# Patient Record
Sex: Male | Born: 1955 | Race: Black or African American | Hispanic: No | Marital: Single | Smoking: Never smoker
Health system: Southern US, Community
[De-identification: ages and names within clinical notes are randomized; demographics above are authoritative.]

---

## 2017-08-06 ENCOUNTER — Other Ambulatory Visit: Payer: Self-pay

## 2017-08-06 ENCOUNTER — Encounter (HOSPITAL_COMMUNITY): Payer: Self-pay | Admitting: Emergency Medicine

## 2017-08-06 ENCOUNTER — Emergency Department (HOSPITAL_COMMUNITY)
Admission: EM | Admit: 2017-08-06 | Discharge: 2017-08-06 | Disposition: A | Discharge status: Home / Self Care | Payer: Medicaid Other | Attending: Emergency Medicine | Admitting: Emergency Medicine

## 2017-08-06 ENCOUNTER — Emergency Department (HOSPITAL_COMMUNITY): Payer: Medicaid Other

## 2017-08-06 DIAGNOSIS — M791 Myalgia, unspecified site: Secondary | ICD-10-CM | POA: Insufficient documentation

## 2017-08-06 DIAGNOSIS — R609 Edema, unspecified: Secondary | ICD-10-CM

## 2017-08-06 DIAGNOSIS — R0682 Tachypnea, not elsewhere classified: Secondary | ICD-10-CM | POA: Diagnosis not present

## 2017-08-06 DIAGNOSIS — R509 Fever, unspecified: Secondary | ICD-10-CM

## 2017-08-06 DIAGNOSIS — R112 Nausea with vomiting, unspecified: Secondary | ICD-10-CM | POA: Diagnosis present

## 2017-08-06 DIAGNOSIS — R638 Other symptoms and signs concerning food and fluid intake: Secondary | ICD-10-CM | POA: Diagnosis not present

## 2017-08-06 DIAGNOSIS — Z7982 Long term (current) use of aspirin: Secondary | ICD-10-CM | POA: Insufficient documentation

## 2017-08-06 DIAGNOSIS — R6883 Chills (without fever): Secondary | ICD-10-CM | POA: Diagnosis not present

## 2017-08-06 DIAGNOSIS — Z79899 Other long term (current) drug therapy: Secondary | ICD-10-CM | POA: Insufficient documentation

## 2017-08-06 LAB — COMPREHENSIVE METABOLIC PANEL
ALBUMIN: 4.1 g/dL (ref 3.5–5.0)
ALT: 12 U/L — ABNORMAL LOW (ref 17–63)
AST: 15 U/L (ref 15–41)
Alkaline Phosphatase: 41 U/L (ref 38–126)
Anion gap: 11 (ref 5–15)
BILIRUBIN TOTAL: 1.3 mg/dL — AB (ref 0.3–1.2)
BUN: 15 mg/dL (ref 6–20)
CO2: 24 mmol/L (ref 22–32)
Calcium: 8.9 mg/dL (ref 8.9–10.3)
Chloride: 101 mmol/L (ref 101–111)
Creatinine, Ser: 1.11 mg/dL (ref 0.61–1.24)
GFR calc non Af Amer: >60 mL/min (ref >60)
GLUCOSE: 124 mg/dL — AB (ref 65–99)
POTASSIUM: 3.8 mmol/L (ref 3.5–5.1)
SODIUM: 136 mmol/L (ref 135–145)
TOTAL PROTEIN: 7.5 g/dL (ref 6.5–8.1)

## 2017-08-06 LAB — CBC
HEMATOCRIT: 37.6 % — AB (ref 39.0–52.0)
HEMOGLOBIN: 11.5 g/dL — AB (ref 13.0–17.0)
MCH: 30.2 pg (ref 26.0–34.0)
MCHC: 30.6 g/dL (ref 30.0–36.0)
MCV: 98.7 fL (ref 78.0–100.0)
Platelets: 237 10*3/uL (ref 150–400)
RBC: 3.81 MIL/uL — ABNORMAL LOW (ref 4.22–5.81)
RDW: 13 % (ref 11.5–15.5)
WBC: 10.3 10*3/uL (ref 4.0–10.5)

## 2017-08-06 LAB — URINALYSIS, ROUTINE W REFLEX MICROSCOPIC
BACTERIA UA: NONE SEEN
BILIRUBIN URINE: NEGATIVE
Glucose, UA: NEGATIVE mg/dL
KETONES UR: NEGATIVE mg/dL
LEUKOCYTES UA: NEGATIVE
NITRITE: NEGATIVE
Protein, ur: 100 mg/dL — AB
Specific Gravity, Urine: 1.019 (ref 1.005–1.030)
pH: 5 (ref 5.0–8.0)

## 2017-08-06 LAB — TROPONIN I: Troponin I: <0.03 ng/mL (ref <0.03)

## 2017-08-06 LAB — INFLUENZA PANEL BY PCR (TYPE A & B)
Influenza A By PCR: NEGATIVE
Influenza B By PCR: NEGATIVE

## 2017-08-06 LAB — BRAIN NATRIURETIC PEPTIDE: B Natriuretic Peptide: 139 pg/mL — ABNORMAL HIGH (ref 0.0–100.0)

## 2017-08-06 LAB — LIPASE, BLOOD: Lipase: 27 U/L (ref 11–51)

## 2017-08-06 LAB — GROUP A STREP BY PCR: GROUP A STREP BY PCR: NOT DETECTED

## 2017-08-06 MED ORDER — ACETAMINOPHEN 325 MG PO TABS
650.0000 mg | ORAL_TABLET | Freq: Once | ORAL | Status: AC
  Administered 2017-08-06: 650 mg via ORAL
  Filled 2017-08-06: qty 2

## 2017-08-06 MED ORDER — IBUPROFEN 800 MG PO TABS
800.0000 mg | ORAL_TABLET | Freq: Once | ORAL | Status: AC
  Administered 2017-08-06: 800 mg via ORAL
  Filled 2017-08-06: qty 1

## 2017-08-06 MED ORDER — SODIUM CHLORIDE 0.9 % IV BOLUS
1000.0000 mL | Freq: Once | INTRAVENOUS | Status: AC
  Administered 2017-08-06: 1000 mL via INTRAVENOUS

## 2017-08-06 MED ORDER — ONDANSETRON HCL 4 MG/2ML IJ SOLN
4.0000 mg | Freq: Once | INTRAMUSCULAR | Status: AC
  Administered 2017-08-06: 4 mg via INTRAVENOUS
  Filled 2017-08-06: qty 2

## 2017-08-06 NOTE — ED Triage Notes (Signed)
Pt is aching all over, headache, as well as nausea and vomiting. This has been going on for 2 days without relief. Pt threw up green bile. Pt has not been taking any OTC meds and can't recall a fever.

## 2017-08-06 NOTE — ED Notes (Signed)
Ambulated pt around nurses station. Pt ambulated with minimal assistance and a steady gait.

## 2017-08-06 NOTE — ED Provider Notes (Signed)
[facility] EMERGENCY DEPARTMENT Provider Note   CSN: [id number] Arrival date & time: 08/06/17  1159     History   Chief Complaint Chief Complaint  Patient presents with  . Nausea    HPI Peter HandlerHenry Roman is a 62 y.o. male.  HPI  The patient is a morbidly obese 62 year old male who reports a prior history of hypertension, he denies tobacco use cardiac disease, pulmonary disease or diabetes.  He reports that he is good about taking his medications and then was in his usual state of health until 2 days ago when he started to develop fevers chills and shaking chills with some myalgias.  He denies significant coughing sore throat headache runny nose blurred vision and has not had no abdominal pain dysuria diarrhea or rectal bleeding.  There is no rashes to his skin, no recent insect bites, he and his wife who is an additional historian endorse that he has had some swelling of his legs which is chronic but gradually worsening.  He did have some associated nausea and vomiting 2 or 3 times today.  He is no longer nauseated and again has no abdominal pain.  His appetite is been decreased, not eating very much, not drinking very much in the way of fluid.  He has no stiff neck, no nasal congestion facial pressure or sinus tenderness.  No medications given prior to arrival.  He does report that he has had some difficulty with balance when he gets to shaking from his fever.  He was noted to have a temperature over 102 in triage  History reviewed. No pertinent past medical history.  There are no active problems to display for this patient.   History reviewed. No pertinent surgical history.      Home Medications    Prior to Admission medications   Medication Sig Start Date End Date Taking? Authorizing Provider  acetaminophen (TYLENOL) 500 MG tablet Take 1,000 mg by mouth every 6 (six) hours as needed.   Yes [provider]  albuterol (PROVENTIL) (2.5 MG/3ML) 0.083% nebulizer solution   07/11/17  Yes [provider]  aspirin EC 81 MG tablet Take 81 mg by mouth daily.   Yes [provider]  azelastine (ASTELIN) 0.1 % nasal spray Place 1 spray into both nostrils 2 (two) times daily. Use in each nostril as directed   Yes [provider]  busPIRone (BUSPAR) 15 MG tablet Take 15 mg by mouth 2 (two) times daily.   Yes [provider]  diltiazem (CARDIZEM CD) 360 MG 24 hr capsule Take 360 mg by mouth daily.   Yes [provider]  doxycycline (VIBRAMYCIN) 100 MG capsule Take 100 mg by mouth 2 (two) times daily. 10 day course starting on 4/16 08/05/17  Yes [provider]  fluticasone (FLONASE) 50 MCG/ACT nasal spray Place 2 sprays into both nostrils daily.  07/11/17  Yes [provider]  fluticasone furoate-vilanterol (BREO ELLIPTA) 200-25 MCG/INH AEPB Inhale 1 puff into the lungs daily.   Yes [provider]  furosemide (LASIX) 20 MG tablet Take 20 mg by mouth daily. 07/16/17  Yes [provider]  ipratropium (ATROVENT) 0.02 % nebulizer solution Take 0.25 mg by nebulization every 6 (six) hours as needed for wheezing or shortness of breath.  07/11/17  Yes [provider]  labetalol (NORMODYNE) 300 MG tablet Take 300 mg by mouth 3 (three) times daily.   Yes [provider]  losartan (COZAAR) 100 MG tablet Take 100 mg by mouth  daily.   Yes [provider]  terazosin (HYTRIN) 5 MG capsule Take 5 mg by mouth daily. 07/29/17  Yes [provider]    Family History History reviewed. No pertinent family history.  Social History Social History   Tobacco Use  . Smoking status: Never Smoker  . Smokeless tobacco: Never Used  Substance Use Topics  . Alcohol use: Never    Frequency: Never  . Drug use: Never     Allergies   Patient has no known allergies.   Review of Systems Review of Systems  All other systems reviewed and are negative.    Physical Exam Updated Vital  Signs BP (!) 170/65   Pulse 79   Temp (!) 100.6 F (38.1 C) (Oral)   Resp 18   Ht 5\' 10"  (1.778 m)   Wt (!) 163.3 kg (360 lb)   SpO2 93%   BMI 51.65 kg/m   Physical Exam  Constitutional: He appears well-developed and well-nourished. No distress.  HENT:  Head: Normocephalic and atraumatic.  Mouth/Throat: Oropharynx is clear and moist. No oropharyngeal exudate.  His membranes are mildly dehydrated but there is no signs of exudate asymmetry hypertrophy or erythema, nasal passages clear  Eyes: Pupils are equal, round, and reactive to light. Conjunctivae and EOM are normal. Right eye exhibits no discharge. Left eye exhibits no discharge. No scleral icterus.  Neck: Normal range of motion. Neck supple. No JVD present. No thyromegaly present.  Very supple neck, no lymphadenopathy  Cardiovascular: Normal rate, regular rhythm, normal heart sounds and intact distal pulses. Exam reveals no gallop and no friction rub.  No murmur heard. Pulmonary/Chest: Effort normal and breath sounds normal. No respiratory distress. He has no wheezes. He has no rales.  The patient is mildly tachypneic but his lungs are overall clear with no rales rhonchi or wheezing.  He speaks in full sentences.  He does prefer to sit straight upright and does not want to lay back stating that it makes him feel like he is more jittery  Abdominal: Soft. Bowel sounds are normal. He exhibits no distension and no mass. There is no tenderness.  Very obese abdomen but totally soft and nontender, no focal tenderness especially right upper and right lower quadrants which are totally benign  Musculoskeletal: Normal range of motion. He exhibits edema. He exhibits no tenderness.  The patient has bilateral pitting edema which is symmetrical of his lower extremities.  Lymphadenopathy:    He has no cervical adenopathy.  Neurological: He is alert. Coordination normal.  Mental status is totally normal, speech is clear, memory is intact and  coordination is normal.  He has normal speech, no facial droop, normal strength in all 4 extremities  Skin: Skin is warm and dry. No rash noted. No erythema.  I see no rashes to his skin diffusely  Psychiatric: He has a normal mood and affect. His behavior is normal.  Nursing note and vitals reviewed.    ED Treatments / Results  Labs (all labs ordered are listed, but only abnormal results are displayed) Labs Reviewed  COMPREHENSIVE METABOLIC PANEL - Abnormal; Notable for the following components:      Result Value   Glucose, Bld 124 (*)    ALT 12 (*)    Total Bilirubin 1.3 (*)    All other components within normal limits  CBC - Abnormal; Notable for the following components:   RBC 3.81 (*)    Hemoglobin 11.5 (*)    HCT 37.6 (*)  All other components within normal limits  URINALYSIS, ROUTINE W REFLEX MICROSCOPIC - Abnormal; Notable for the following components:   Hgb urine dipstick MODERATE (*)    Protein, ur 100 (*)    Squamous Epithelial / LPF 0-5 (*)    All other components within normal limits  BRAIN NATRIURETIC PEPTIDE - Abnormal; Notable for the following components:   B Natriuretic Peptide 139.0 (*)    All other components within normal limits  GROUP A STREP BY PCR  LIPASE, BLOOD  INFLUENZA PANEL BY PCR (TYPE A & B)  TROPONIN I    EKG EKG Interpretation  Date/Time:  Wednesday August 06 2017 16:43:10 EDT Ventricular Rate:  69 PR Interval:    QRS Duration: 99 QT Interval:  374 QTC Calculation: 401 R Axis:   -14 Text Interpretation:  Sinus rhythm Borderline prolonged PR interval Anteroseptal infarct, age indeterminate No old tracing to compare Abnormal ekg Confirmed by Eber Hong ([id number]) on 08/06/2017 4:47:07 PM   Radiology Dg Chest 2 View  Result Date: 08/06/2017 CLINICAL DATA:  Nausea, vomiting, chills EXAM: CHEST - 2 VIEW COMPARISON:  Radiograph 08/06/2017 FINDINGS: Normal cardiac silhouette with ectatic aorta. Low lung volumes. Central venous pulmonary  congestion. No effusion, infiltrate pneumothorax. IMPRESSION: Cardiomegaly and central venous congestion. Low lung volumes. No infiltrate or edema identified. Electronically Signed   By: Genevive Bi M.D.   On: 08/06/2017 17:09    Procedures Procedures (including critical care time)  Medications Ordered in ED Medications  acetaminophen (TYLENOL) tablet 650 mg (650 mg Oral Given 08/06/17 1323)  sodium chloride 0.9 % bolus 1,000 mL (0 mLs Intravenous Stopped 08/06/17 1834)  ondansetron (ZOFRAN) injection 4 mg (4 mg Intravenous Given 08/06/17 1729)  ibuprofen (ADVIL,MOTRIN) tablet 800 mg (800 mg Oral Given 08/06/17 1856)     Initial Impression / Assessment and Plan / ED Course  I have reviewed the triage vital signs and the nursing notes.  Pertinent labs & imaging results that were available during my care of the patient were reviewed by me and considered in my medical decision making (see chart for details).  Clinical Course as of Aug 07 2003  Wed Aug 06, 2017  1738 The patient's lab work has been very reassuring and unremarkable, his exam is unchanged and well-appearing, his chest x-ray shows no signs of infiltrate, he does have a large heart and some cardiomegaly but no pulmonary edema.  The patient has been made aware of all of these findings.   [BM]    Clinical Course User Index [BM] Eber Hong, MD   The patient does have an element of fever however he has no source of infection, no tachycardia, no hypotension and no leukocytosis on his lab work.  I do not think he is septic.  He does however have some increased swelling and shortness of breath so I will add a chest x-ray as well as some cardiac labs as the patient is a at risk for cardiac disease including congestive heart failure.  I hear no murmurs and he has no chest pain therefore I doubt this is myocarditis, endocarditis or other occult infections.  Strep test negative, flu test negative, at this point the patient feels better  is asymptomatic and other than a very low fever which has improved with medications he has no other symptoms or signs of infection.  Labs overall unremarkable, no signs of heart failure, cardiac disease or infection, fever has defervesced, there is no other abnormal vital signs, the patient is well-appearing,  has ambulated without difficulty and has expressed his understanding to the indications for return.  I do not think at this time that he needs to be admitted to the hospital and without a bacterial source of infection and antibiotics are not indicated either  Final Clinical Impressions(s) / ED Diagnoses   Final diagnoses:  Fever, unspecified fever cause  Peripheral edema    ED Discharge Orders    None       Eber Hong, MD 08/06/17 2005

## 2017-08-06 NOTE — ED Triage Notes (Signed)
Pt brought in by [facility] with c/o n/v, chills, generalized pain, cough x 2 days. Pt was seen by PCP yesterday and given Doxycycline. [facility] cough syrup and Zofran. Pt reports to EMS that the medications aren't making him feel better so he wanted to be evaluated again.

## 2017-08-06 NOTE — ED Notes (Signed)
Pt c/o of sore throat as well

## 2017-08-06 NOTE — ED Notes (Addendum)
Nurses were told by registration that patient was shaking. Pt not having any seizure like symptoms. Not postictal. Pt did spike a fever while here, but administered Tylenol with some relief. Brought pt to waiting room behind triage to watch more closely

## 2017-08-06 NOTE — Discharge Instructions (Signed)
Your testing today showed no specific findings, your blood work was normal, urine was normal, x-ray was normal, you do have a fever which can be treated with either Tylenol or ibuprofen alternating every 4 hours.  Return to the emergency department for severe or worsening pain swelling difficulty breathing vomiting rashes or any other concerns.   have your family doctor follow-up on your swelling of your legs.

## 2017-08-06 NOTE — ED Notes (Signed)
Have notified another nurse and PA of patient feeling weak and shaking. Pt not postictal and showing no seizure like activity. Per PA, not able to see pt in fast track. Per nurse, recheck vital signs.

## 2018-09-15 DIAGNOSIS — I10 Essential (primary) hypertension: Secondary | ICD-10-CM | POA: Diagnosis not present

## 2018-09-15 DIAGNOSIS — Z6841 Body Mass Index (BMI) 40.0 and over, adult: Secondary | ICD-10-CM | POA: Diagnosis not present

## 2018-09-15 DIAGNOSIS — K210 Gastro-esophageal reflux disease with esophagitis: Secondary | ICD-10-CM | POA: Diagnosis not present

## 2018-09-15 DIAGNOSIS — J4520 Mild intermittent asthma, uncomplicated: Secondary | ICD-10-CM | POA: Diagnosis not present

## 2018-09-28 DIAGNOSIS — I87332 Chronic venous hypertension (idiopathic) with ulcer and inflammation of left lower extremity: Secondary | ICD-10-CM | POA: Diagnosis not present

## 2018-10-20 DIAGNOSIS — M79676 Pain in unspecified toe(s): Secondary | ICD-10-CM | POA: Diagnosis not present

## 2018-10-20 DIAGNOSIS — B351 Tinea unguium: Secondary | ICD-10-CM | POA: Diagnosis not present

## 2018-10-28 DIAGNOSIS — G8929 Other chronic pain: Secondary | ICD-10-CM | POA: Diagnosis not present

## 2018-10-28 DIAGNOSIS — M25552 Pain in left hip: Secondary | ICD-10-CM | POA: Diagnosis not present

## 2018-10-28 DIAGNOSIS — M545 Low back pain: Secondary | ICD-10-CM | POA: Diagnosis not present

## 2018-10-28 DIAGNOSIS — M79642 Pain in left hand: Secondary | ICD-10-CM | POA: Diagnosis not present

## 2018-10-28 DIAGNOSIS — Z79891 Long term (current) use of opiate analgesic: Secondary | ICD-10-CM | POA: Diagnosis not present

## 2018-10-28 DIAGNOSIS — G894 Chronic pain syndrome: Secondary | ICD-10-CM | POA: Diagnosis not present

## 2018-12-02 DIAGNOSIS — M25552 Pain in left hip: Secondary | ICD-10-CM | POA: Diagnosis not present

## 2018-12-02 DIAGNOSIS — G8929 Other chronic pain: Secondary | ICD-10-CM | POA: Diagnosis not present

## 2018-12-02 DIAGNOSIS — M79642 Pain in left hand: Secondary | ICD-10-CM | POA: Diagnosis not present

## 2018-12-02 DIAGNOSIS — G894 Chronic pain syndrome: Secondary | ICD-10-CM | POA: Diagnosis not present

## 2018-12-02 DIAGNOSIS — M545 Low back pain: Secondary | ICD-10-CM | POA: Diagnosis not present

## 2018-12-02 DIAGNOSIS — Z79891 Long term (current) use of opiate analgesic: Secondary | ICD-10-CM | POA: Diagnosis not present

## 2018-12-16 DIAGNOSIS — Z0000 Encounter for general adult medical examination without abnormal findings: Secondary | ICD-10-CM | POA: Diagnosis not present

## 2018-12-16 DIAGNOSIS — J4520 Mild intermittent asthma, uncomplicated: Secondary | ICD-10-CM | POA: Diagnosis not present

## 2018-12-16 DIAGNOSIS — G4730 Sleep apnea, unspecified: Secondary | ICD-10-CM | POA: Diagnosis not present

## 2018-12-16 DIAGNOSIS — K210 Gastro-esophageal reflux disease with esophagitis: Secondary | ICD-10-CM | POA: Diagnosis not present

## 2018-12-16 DIAGNOSIS — I10 Essential (primary) hypertension: Secondary | ICD-10-CM | POA: Diagnosis not present

## 2018-12-29 DIAGNOSIS — B351 Tinea unguium: Secondary | ICD-10-CM | POA: Diagnosis not present

## 2018-12-29 DIAGNOSIS — M79676 Pain in unspecified toe(s): Secondary | ICD-10-CM | POA: Diagnosis not present

## 2019-01-01 DIAGNOSIS — M79642 Pain in left hand: Secondary | ICD-10-CM | POA: Diagnosis not present

## 2019-01-01 DIAGNOSIS — M545 Low back pain: Secondary | ICD-10-CM | POA: Diagnosis not present

## 2019-01-01 DIAGNOSIS — G8929 Other chronic pain: Secondary | ICD-10-CM | POA: Diagnosis not present

## 2019-01-01 DIAGNOSIS — M25552 Pain in left hip: Secondary | ICD-10-CM | POA: Diagnosis not present

## 2019-01-01 DIAGNOSIS — G894 Chronic pain syndrome: Secondary | ICD-10-CM | POA: Diagnosis not present

## 2019-01-01 DIAGNOSIS — Z79891 Long term (current) use of opiate analgesic: Secondary | ICD-10-CM | POA: Diagnosis not present

## 2019-01-28 DIAGNOSIS — Z79891 Long term (current) use of opiate analgesic: Secondary | ICD-10-CM | POA: Diagnosis not present

## 2019-01-28 DIAGNOSIS — M79642 Pain in left hand: Secondary | ICD-10-CM | POA: Diagnosis not present

## 2019-01-28 DIAGNOSIS — G8929 Other chronic pain: Secondary | ICD-10-CM | POA: Diagnosis not present

## 2019-01-28 DIAGNOSIS — G894 Chronic pain syndrome: Secondary | ICD-10-CM | POA: Diagnosis not present

## 2019-01-28 DIAGNOSIS — M25552 Pain in left hip: Secondary | ICD-10-CM | POA: Diagnosis not present

## 2019-01-28 DIAGNOSIS — M545 Low back pain: Secondary | ICD-10-CM | POA: Diagnosis not present

## 2019-02-25 DIAGNOSIS — M79642 Pain in left hand: Secondary | ICD-10-CM | POA: Diagnosis not present

## 2019-02-25 DIAGNOSIS — G894 Chronic pain syndrome: Secondary | ICD-10-CM | POA: Diagnosis not present

## 2019-02-25 DIAGNOSIS — Z79891 Long term (current) use of opiate analgesic: Secondary | ICD-10-CM | POA: Diagnosis not present

## 2019-02-25 DIAGNOSIS — G8929 Other chronic pain: Secondary | ICD-10-CM | POA: Diagnosis not present

## 2019-02-25 DIAGNOSIS — M25552 Pain in left hip: Secondary | ICD-10-CM | POA: Diagnosis not present

## 2019-02-25 DIAGNOSIS — M545 Low back pain: Secondary | ICD-10-CM | POA: Diagnosis not present

## 2019-03-09 DIAGNOSIS — M79676 Pain in unspecified toe(s): Secondary | ICD-10-CM | POA: Diagnosis not present

## 2019-03-09 DIAGNOSIS — B351 Tinea unguium: Secondary | ICD-10-CM | POA: Diagnosis not present

## 2019-03-24 DIAGNOSIS — K219 Gastro-esophageal reflux disease without esophagitis: Secondary | ICD-10-CM | POA: Diagnosis not present

## 2019-03-24 DIAGNOSIS — G4730 Sleep apnea, unspecified: Secondary | ICD-10-CM | POA: Diagnosis not present

## 2019-03-24 DIAGNOSIS — J4520 Mild intermittent asthma, uncomplicated: Secondary | ICD-10-CM | POA: Diagnosis not present

## 2019-03-24 DIAGNOSIS — N182 Chronic kidney disease, stage 2 (mild): Secondary | ICD-10-CM | POA: Diagnosis not present

## 2019-03-24 DIAGNOSIS — I10 Essential (primary) hypertension: Secondary | ICD-10-CM | POA: Diagnosis not present

## 2019-03-26 DIAGNOSIS — M25552 Pain in left hip: Secondary | ICD-10-CM | POA: Diagnosis not present

## 2019-03-26 DIAGNOSIS — G8929 Other chronic pain: Secondary | ICD-10-CM | POA: Diagnosis not present

## 2019-03-26 DIAGNOSIS — Z79891 Long term (current) use of opiate analgesic: Secondary | ICD-10-CM | POA: Diagnosis not present

## 2019-03-26 DIAGNOSIS — M545 Low back pain: Secondary | ICD-10-CM | POA: Diagnosis not present

## 2019-03-26 DIAGNOSIS — M79642 Pain in left hand: Secondary | ICD-10-CM | POA: Diagnosis not present

## 2019-03-26 DIAGNOSIS — G894 Chronic pain syndrome: Secondary | ICD-10-CM | POA: Diagnosis not present

## 2019-04-11 DIAGNOSIS — E1165 Type 2 diabetes mellitus with hyperglycemia: Secondary | ICD-10-CM | POA: Diagnosis not present

## 2019-04-11 DIAGNOSIS — I469 Cardiac arrest, cause unspecified: Secondary | ICD-10-CM | POA: Diagnosis not present

## 2019-04-11 DIAGNOSIS — I499 Cardiac arrhythmia, unspecified: Secondary | ICD-10-CM | POA: Diagnosis not present

## 2019-04-11 DIAGNOSIS — R404 Transient alteration of awareness: Secondary | ICD-10-CM | POA: Diagnosis not present

## 2019-04-11 DIAGNOSIS — R001 Bradycardia, unspecified: Secondary | ICD-10-CM | POA: Diagnosis not present

## 2019-04-23 DIAGNOSIS — 419620001 Death: Secondary | SNOMED CT | POA: Diagnosis not present

## 2019-04-23 DEATH — deceased

## 2019-04-25 IMAGING — DX DG CHEST 2V
2 series · 2 of 2 positions shown · non-contrast
Comparison: Radiograph 08/06/2017

CLINICAL DATA: Nausea, vomiting, chills

EXAM:
CHEST - 2 VIEW

[chest lat]
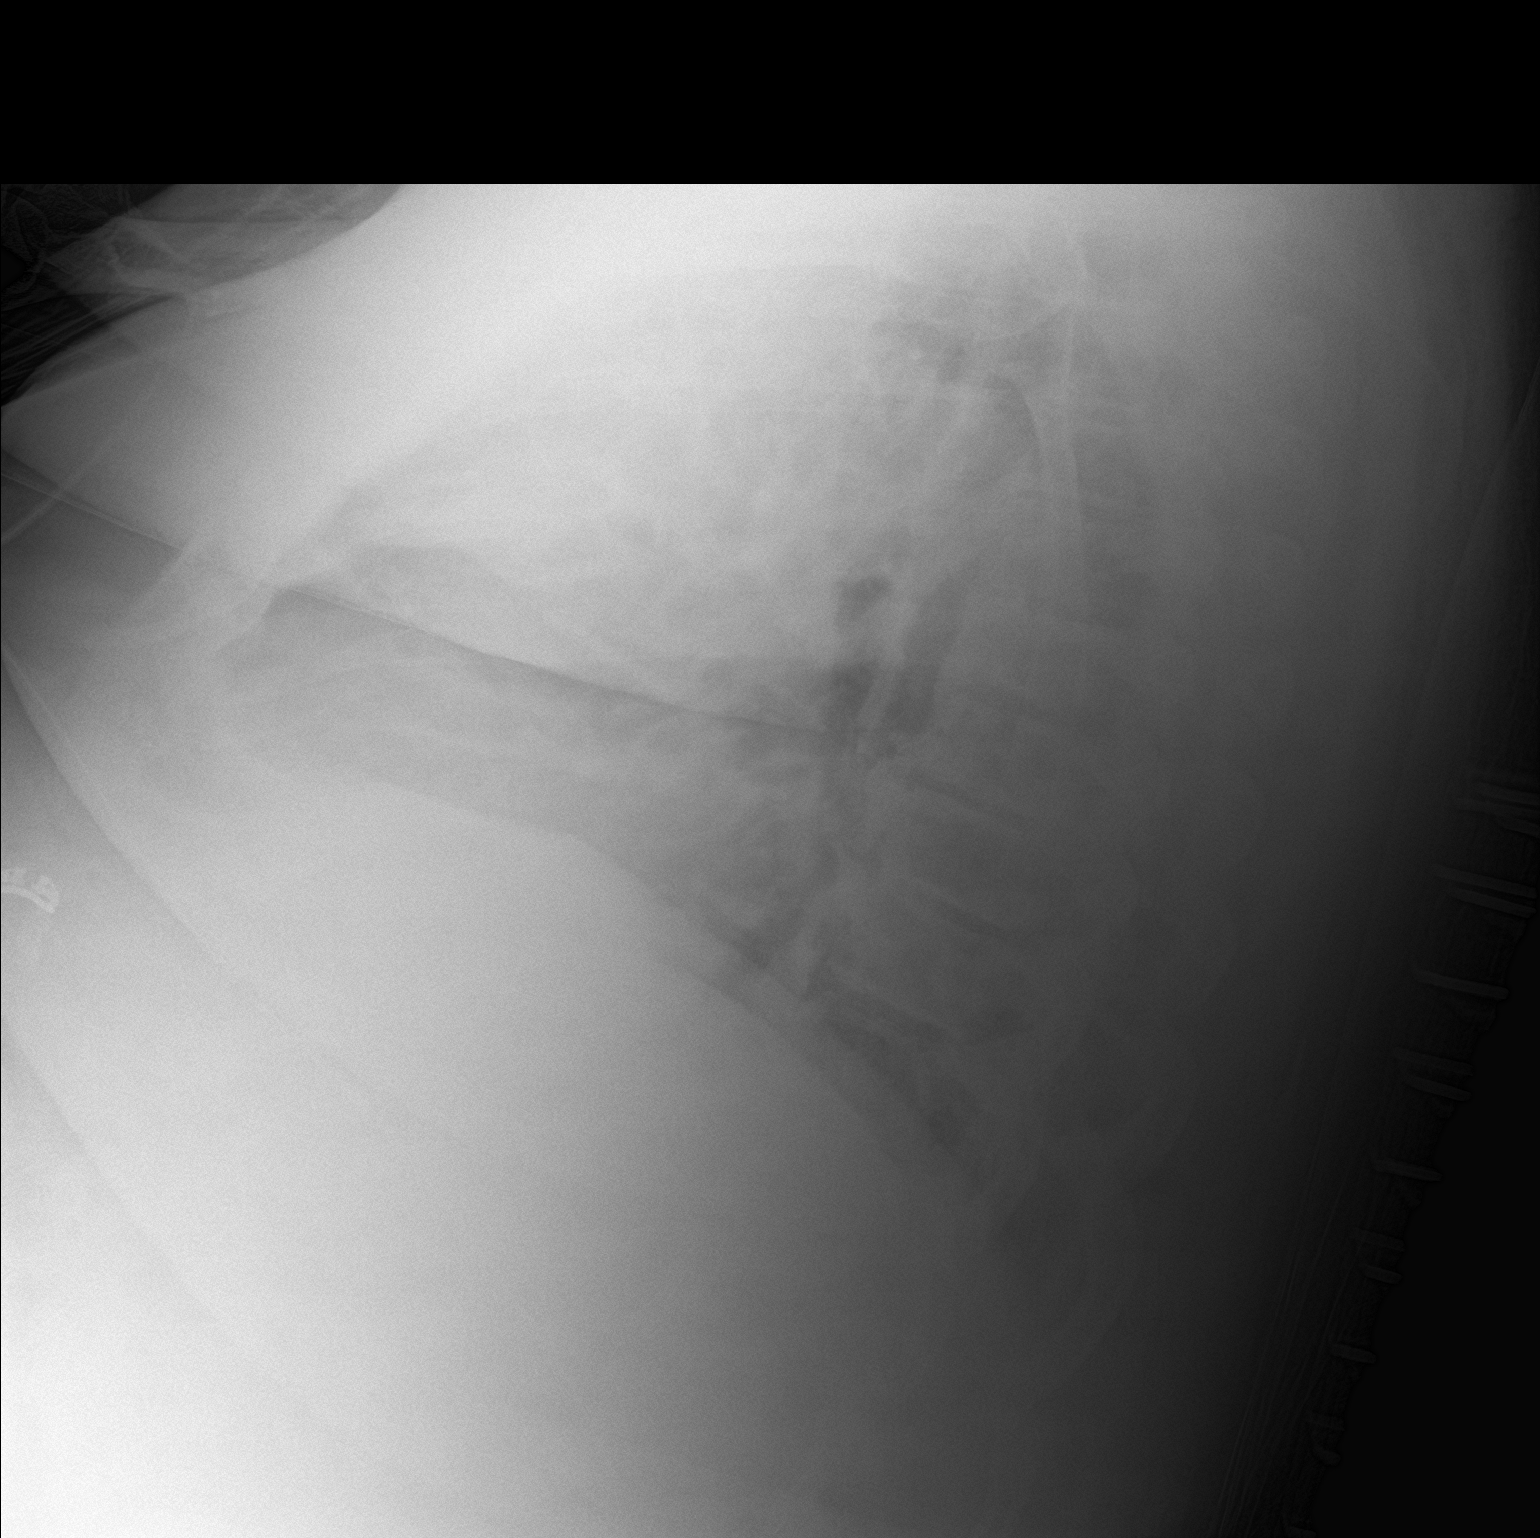

[chest ap]
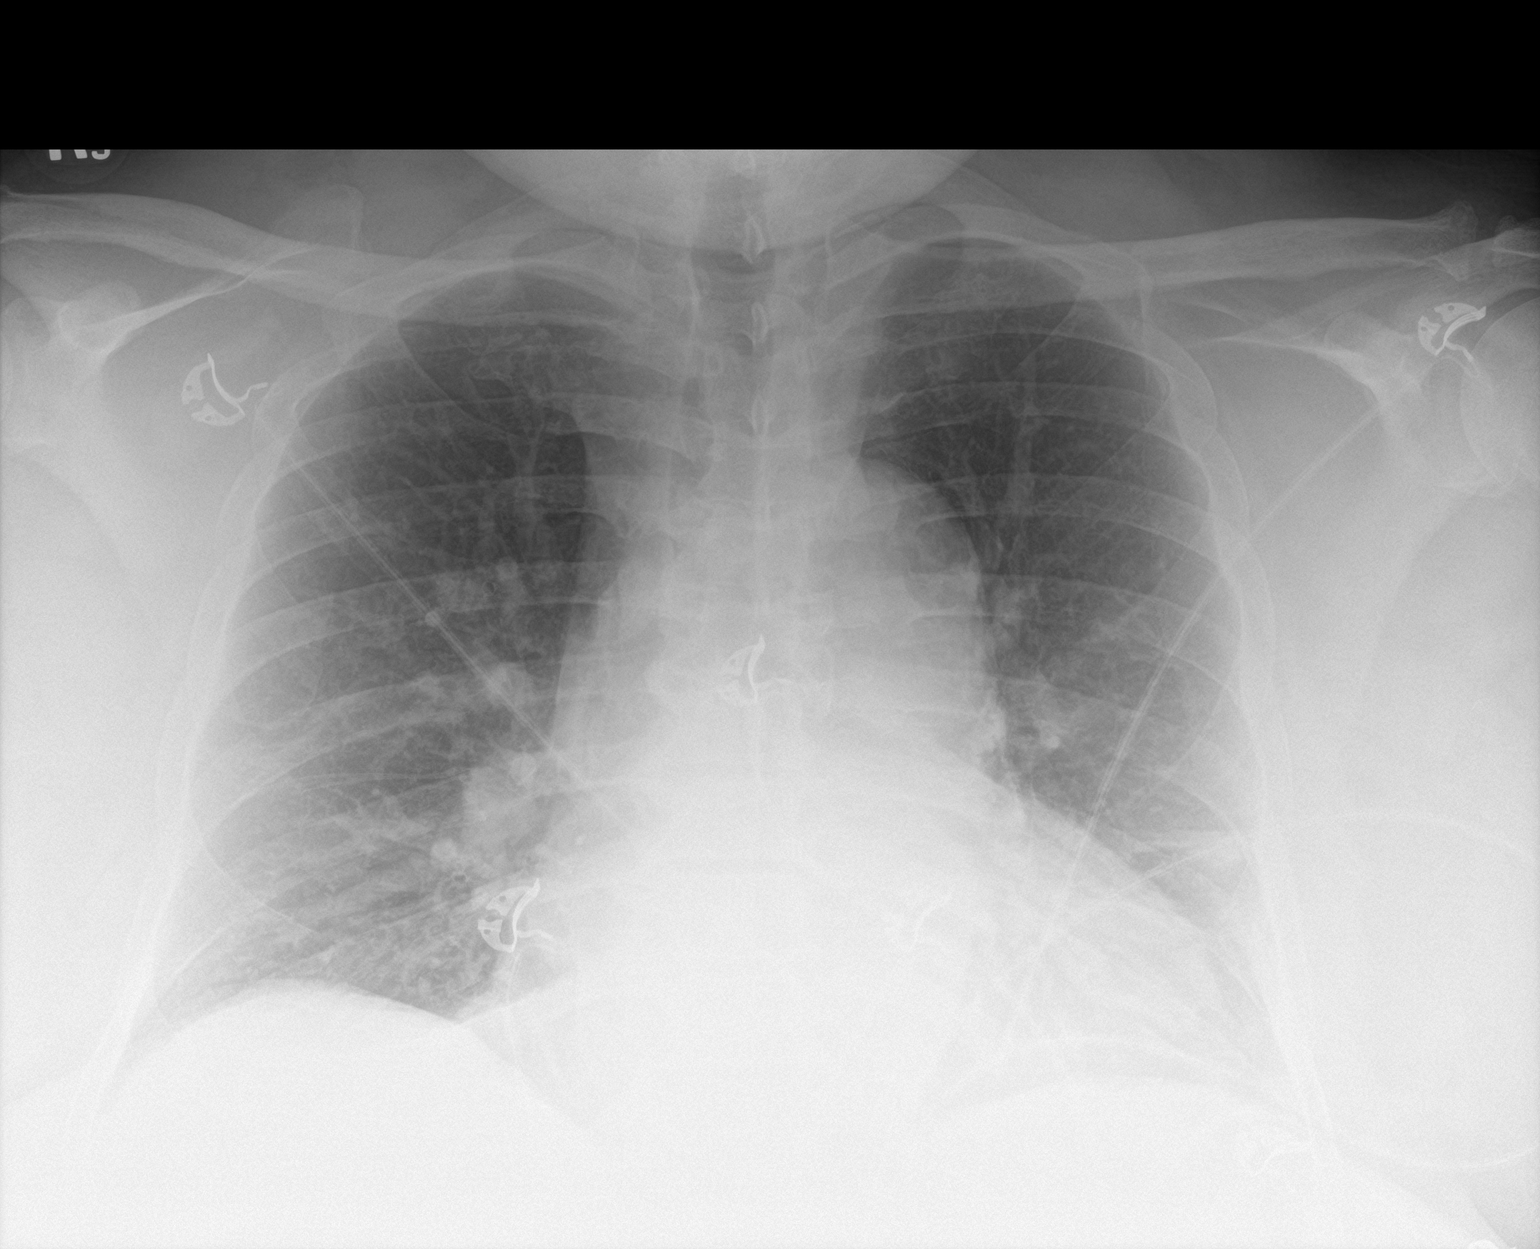

[2 of 2 positions shown; findings below may reference images not displayed]

FINDINGS: Normal cardiac silhouette with ectatic aorta. Low lung volumes.
Central venous pulmonary congestion. No effusion, infiltrate
pneumothorax.
IMPRESSION: Cardiomegaly and central venous congestion. Low lung volumes. No
infiltrate or edema identified.
